# Patient Record
Sex: Male | Born: 1950 | Race: Black or African American | Hispanic: No | Marital: Married | Smoking: Never smoker
Health system: Southern US, Community
[De-identification: ages and names within clinical notes are randomized; demographics above are authoritative.]

## PROBLEM LIST (undated history)

## (undated) DIAGNOSIS — E7800 Pure hypercholesterolemia, unspecified: Secondary | ICD-10-CM

## (undated) DIAGNOSIS — K589 Irritable bowel syndrome without diarrhea: Secondary | ICD-10-CM

## (undated) DIAGNOSIS — G4733 Obstructive sleep apnea (adult) (pediatric): Secondary | ICD-10-CM

## (undated) DIAGNOSIS — E291 Testicular hypofunction: Secondary | ICD-10-CM

## (undated) DIAGNOSIS — J411 Mucopurulent chronic bronchitis: Secondary | ICD-10-CM

## (undated) DIAGNOSIS — K635 Polyp of colon: Secondary | ICD-10-CM

## (undated) DIAGNOSIS — J189 Pneumonia, unspecified organism: Secondary | ICD-10-CM

## (undated) DIAGNOSIS — M7580 Other shoulder lesions, unspecified shoulder: Secondary | ICD-10-CM

## (undated) DIAGNOSIS — N529 Male erectile dysfunction, unspecified: Secondary | ICD-10-CM

## (undated) HISTORY — PX: OTHER SURGICAL HISTORY: SHX169

## (undated) HISTORY — DX: Pure hypercholesterolemia, unspecified: E78.00

## (undated) HISTORY — DX: Obstructive sleep apnea (adult) (pediatric): G47.33

## (undated) HISTORY — DX: Mucopurulent chronic bronchitis: J41.1

## (undated) HISTORY — DX: Testicular hypofunction: E29.1

## (undated) HISTORY — DX: Irritable bowel syndrome, unspecified: K58.9

## (undated) HISTORY — DX: Other shoulder lesions, unspecified shoulder: M75.80

## (undated) HISTORY — DX: Male erectile dysfunction, unspecified: N52.9

## (undated) HISTORY — DX: Polyp of colon: K63.5

## (undated) HISTORY — DX: Pneumonia, unspecified organism: J18.9

---

## 2001-11-25 ENCOUNTER — Ambulatory Visit (HOSPITAL_COMMUNITY): Admission: RE | Admit: 2001-11-25 | Discharge: 2001-11-25 | Payer: Self-pay | Admitting: Family Medicine

## 2001-11-25 ENCOUNTER — Encounter: Payer: Self-pay | Admitting: Family Medicine

## 2003-12-01 ENCOUNTER — Ambulatory Visit (HOSPITAL_COMMUNITY): Admission: RE | Admit: 2003-12-01 | Discharge: 2003-12-01 | Payer: Self-pay | Admitting: Family Medicine

## 2004-01-10 ENCOUNTER — Ambulatory Visit (HOSPITAL_COMMUNITY): Admission: RE | Admit: 2004-01-10 | Discharge: 2004-01-10 | Payer: Self-pay | Admitting: Gastroenterology

## 2004-01-10 ENCOUNTER — Encounter (INDEPENDENT_AMBULATORY_CARE_PROVIDER_SITE_OTHER): Payer: Self-pay | Admitting: Specialist

## 2009-02-16 ENCOUNTER — Encounter: Admission: RE | Admit: 2009-02-16 | Discharge: 2009-02-16 | Payer: Self-pay | Admitting: Family Medicine

## 2010-02-13 IMAGING — CR DG CHEST 2V
2 series · 2 of 2 positions shown · non-contrast
Comparison: No prior studies.

CLINICAL DATA: Cough for 3 months.

CHEST - 2 VIEW

[view not recorded (1 of 2)]
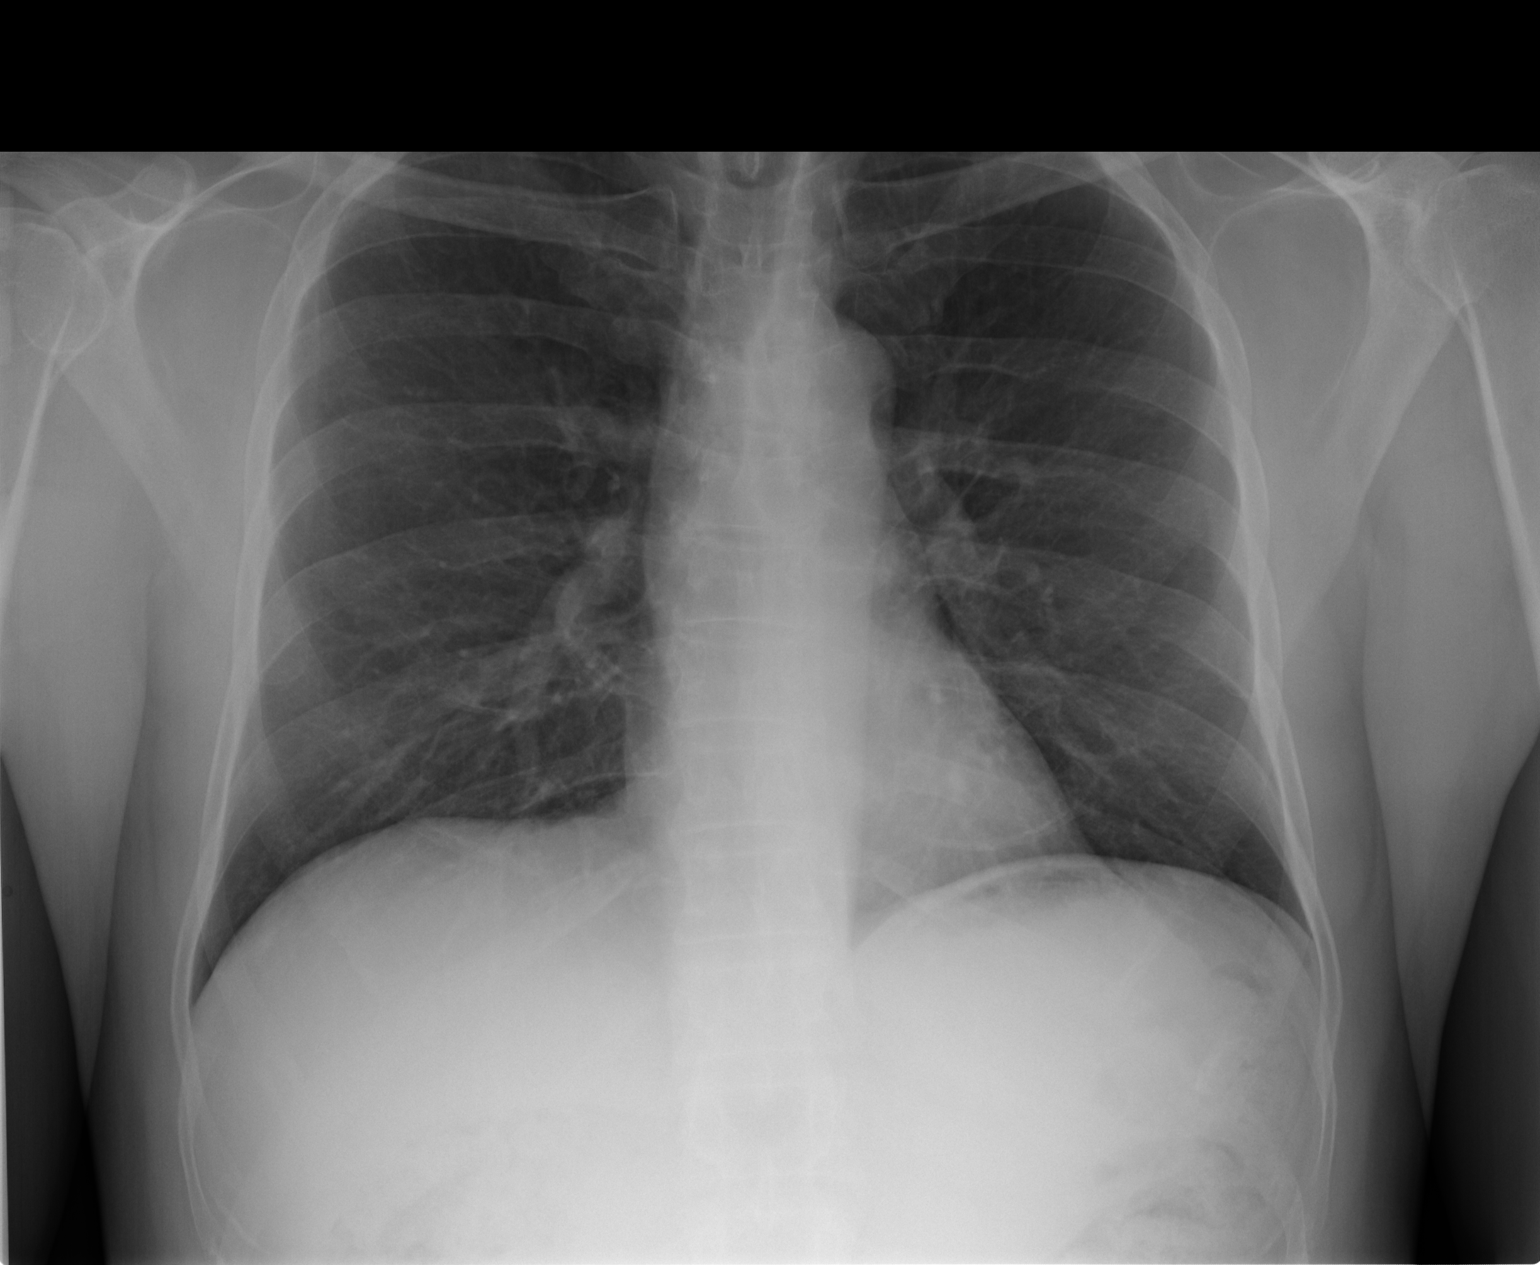

[view not recorded (2 of 2)]
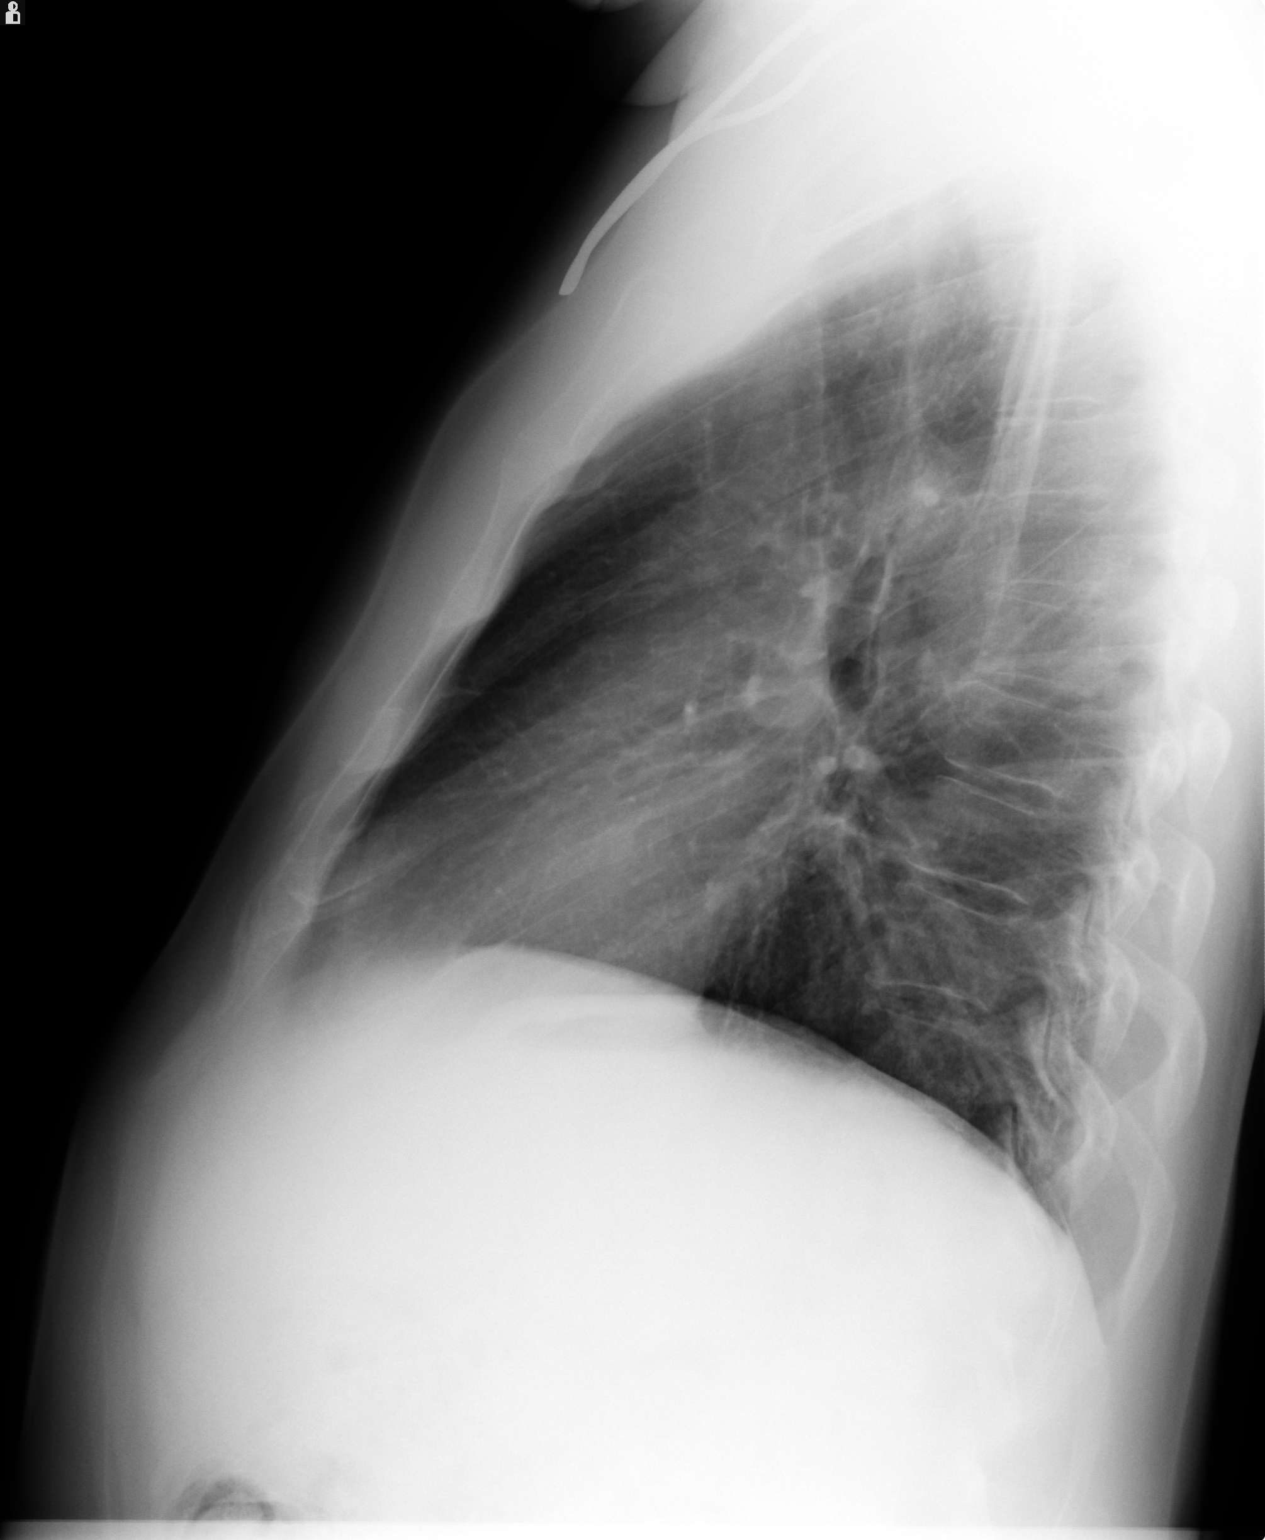

[2 of 2 positions shown; findings below may reference images not displayed]

FINDINGS: The lungs are clear.  There are mildly accentuated
bronchial markings.  The heart and mediastinal structures have a
normal appearance.
IMPRESSION: Mildly accentuated bronchial markings consistent with bronchitic
changes.  No acute infiltrates.

## 2011-05-04 NOTE — Op Note (Signed)
NAME:  STOCKTON, NUNLEY                      ACCOUNT NO.:  [id number]   MEDICAL RECORD NO.:  [id number]                   PATIENT TYPE:  AMB   LOCATION:  ENDO                                 FACILITY:  [facility]   PHYSICIAN:  Petra Kuba, M.D.                 DATE OF BIRTH:  06-15-1951   DATE OF PROCEDURE:  01/10/2004  DATE OF DISCHARGE:                                 OPERATIVE REPORT   PROCEDURE:  Colonoscopy with hot biopsy.   INDICATION:  Patient with a family history of colon cancer, increased  constipation, due for colonic screening.  Consent was signed after risks,  benefits, methods, options thoroughly discussed in the office and before any  premedications given.   MEDICINES USED:  1. Demerol 50.  2. Versed 10.   DESCRIPTION OF PROCEDURE:  Rectal inspection was pertinent for external  hemorrhoids, small.  Digital exam was negative.  Video pediatric adjustable  colonoscope was inserted and with some difficulty due to some tortuosity and  some looping, was able to be advanced to the cecum.  No abnormality was seen  on insertion.  To advance to the cecum required rolling him on his back and  various abdominal pressures.  No abnormality was seen on insertion.  The  cecum was identified by the appendiceal orifice and the ileocecal valve.  The prep was adequate.  There was some liquid stool that required washing  and suctioning.  On slow withdrawal through the colon, however, there were  no abnormalities seen.  When we fell back around a tortuous loop, we did try  to readvance around the curves to decrease chances of missing things.  He  did not tolerate insertion very well, but there were no obvious  complications.  He had some difficulty holding air which made complete  sigmoid evaluation difficult.  In the distal rectum, a tiny polyp was seen  and was hot biopsied x 1.  Anorectal pull-through and retroflexion confirmed  some small hemorrhoids.  Scope was reinserted a short  ways up the left side  of the colon; air was suctioned and scope removed.  The patient tolerated  the procedure fair.  There was no obvious immediate complication.   ENDOSCOPIC DIAGNOSES:  1. Internal-external small hemorrhoids.  2. Tiny rectal distal polyp, hot biopsied.  3. Otherwise within normal limits to the cecum except for some tortuosity     and looping.   PLAN:  1. Await pathology to determine future colonic screening.  2. Consider Miralax or Crystallose p.r.n.  3. Happy to see back p.r.n.  4. Otherwise, yearly rectals and guaiacs per Dr. Cliffton Asters.  5. Consider Diprivan and/or a regular scope next time to see if that will     help with the looping because it would be stiffer.  Petra Kuba, M.D.    MEM/MEDQ  D:  01/10/2004  T:  01/10/2004  Job:  [id number]   cc:   Stacie Acres. White, M.D.  [address]  [place]  [place] [address]  Fax: [phone]

## 2011-08-28 ENCOUNTER — Ambulatory Visit
Admission: RE | Admit: 2011-08-28 | Discharge: 2011-08-28 | Disposition: A | Discharge status: Home / Self Care | Payer: BC Managed Care – PPO | Source: Ambulatory Visit | Attending: Family Medicine | Admitting: Family Medicine

## 2011-08-28 ENCOUNTER — Other Ambulatory Visit: Payer: Self-pay | Admitting: Family Medicine

## 2011-08-28 DIAGNOSIS — M25562 Pain in left knee: Secondary | ICD-10-CM

## 2011-11-14 ENCOUNTER — Encounter (HOSPITAL_BASED_OUTPATIENT_CLINIC_OR_DEPARTMENT_OTHER): Admission: RE | Payer: Self-pay | Source: Ambulatory Visit

## 2011-11-14 ENCOUNTER — Ambulatory Visit (HOSPITAL_BASED_OUTPATIENT_CLINIC_OR_DEPARTMENT_OTHER): Admission: RE | Admit: 2011-11-14 | Payer: 59 | Source: Ambulatory Visit | Admitting: Orthopedic Surgery

## 2011-11-14 SURGERY — ARTHROSCOPY, KNEE
Anesthesia: General | Laterality: Left

## 2017-01-01 ENCOUNTER — Telehealth: Payer: Self-pay

## 2017-01-01 NOTE — Telephone Encounter (Signed)
SENT NOTES TO SCHEDULING 

## 2017-01-23 DIAGNOSIS — I2089 Other forms of angina pectoris: Secondary | ICD-10-CM | POA: Insufficient documentation

## 2017-01-23 DIAGNOSIS — I208 Other forms of angina pectoris: Secondary | ICD-10-CM

## 2017-01-25 ENCOUNTER — Encounter: Payer: Self-pay | Admitting: Cardiology

## 2017-01-25 ENCOUNTER — Encounter (INDEPENDENT_AMBULATORY_CARE_PROVIDER_SITE_OTHER): Payer: Self-pay

## 2017-01-25 ENCOUNTER — Ambulatory Visit (INDEPENDENT_AMBULATORY_CARE_PROVIDER_SITE_OTHER): Payer: BLUE CROSS/BLUE SHIELD | Admitting: Cardiology

## 2017-01-25 VITALS — BP 124/70 | HR 89 | Ht 69.5 in | Wt 173.6 lb

## 2017-01-25 DIAGNOSIS — R0789 Other chest pain: Secondary | ICD-10-CM | POA: Diagnosis not present

## 2017-01-25 NOTE — Progress Notes (Signed)
Cardiology Office Note    Date:  01/25/2017   ID:  Dennis Shaffer, DOB 1951-06-13, MRN [id number]  PCP:  No primary care provider on file.  Cardiologist:   Donato SchultzMark Lacole Komorowski, MD     History of Present Illness:  Dennis Shaffer is a 66 y.o. male here for evaluation of angina at the request of Dr. Susa SimmondsVia. He's been having chest discomfort with physical activity such as playing basketball with his children running around. He feels some shortness of breath that seems to resolve with rest. About 6 years ago had a stress test that was low risk. 08/23/2009-normal study, no ischemia. Heart ejection fraction 64%.  He is also noting pain in the center of his chest when walking up an incline in his neighborhood. This is intermittent. He thought that this was secondary to bronchitis, pleuritis several years ago. Sensation is described as breathing and something really cold. He also describes an occasional palpitation when stressed, flop, likely PVC.  No TOB, no DM. 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  Cialis intermittently  Past Medical History:  Diagnosis Date  . Androgen deficiency   . Bronchitis, mucopurulent recurrent (HCC)   . Colon polyps   . ED (erectile dysfunction)   . Elevated LDL cholesterol level   . IBS (irritable bowel syndrome)   . OSA (obstructive sleep apnea)   . Rotator cuff tendonitis     Past Surgical History:  Procedure Laterality Date  . colonscopy      Current Medications: No outpatient prescriptions prior to visit.   No facility-administered medications prior to visit.      Allergies:   Naprosyn [naproxen] and Voltaren [diclofenac sodium]   Social History   Social History  . Marital status: Single    Spouse name: N/A  . Number of children: N/A  . Years of education: N/A   Social History Main Topics  . Smoking status: Never  Smoker  . Smokeless tobacco: Never Used  . Alcohol use Yes     Comment: rarely  . Drug use: No  . Sexual activity: Not Asked   Other Topics Concern  . None   Social History Narrative  . None     Family History:  The patient's family history includes Cancer in his maternal grandfather; Gallstones in his mother; Healthy in his daughter and daughter.   ROS:   Please see the history of present illness.    ROS All other systems reviewed and are negative.   PHYSICAL EXAM:   VS:  BP 124/70   Pulse 89   Ht 5' 9.5" (1.765 m)   Wt 173 lb 9.6 oz (78.7 kg)   BMI 25.27 kg/m    GEN: Well nourished, well developed, in no acute distress  HEENT: normal  Neck: no JVD, carotid bruits, or masses Cardiac: RRR; no murmurs, rubs, or gallops,no edema  Respiratory:  clear to auscultation bilaterally, normal work of breathing GI: soft, nontender, nondistended, + BS MS: no deformity or atrophy  Skin: warm and dry, no rash Neuro:  Alert and Oriented x 3, Strength and sensation are intact Psych: euthymic mood, full affect  Wt Readings from Last 3 Encounters:  01/25/17 173 lb 9.6 oz (78.7 kg)      Studies/Labs Reviewed:   EKG:  EKG is ordered today.  The ekg ordered today demonstrates 01/25/17  shows sinus rhythm rate 89, inferior Q waves, nonspecific ST-T wave changes.  Recent Labs: No results found for requested labs within last 8760 hours.   Lipid Panel No results found for: CHOL, TRIG, HDL, CHOLHDL, VLDL, LDLCALC, LDLDIRECT  Additional studies/ records that were reviewed today include:  Prior lab work, office notes, stress test reviewed    ASSESSMENT:    1. Other chest pain      PLAN:  In order of problems listed above:  Chest pain  - Been experiencing occasional intermittent exertional chest discomfort when walking uphill for instance.  - We will go ahead and check a nuclear stress test, treadmill  - Could still be musculoskeletal perhaps respiratory  Otherwise, no  evidence of hypertension. No medications. Continuing with lifestyle modification, diet, exercise.     Medication Adjustments/Labs and Tests Ordered: Current medicines are reviewed at length with the patient today.  Concerns regarding medicines are outlined above.  Medication changes, Labs and Tests ordered today are listed in the Patient Instructions below. Patient Instructions  Medication Instructions:  The current medical regimen is effective;  continue present plan and medications.  Testing/Procedures: Your physician has requested that you have myoview. For further information please visit [email or web address] Please follow instruction sheet, as given.  Follow-Up: Follow up as needed after the above testing.  Thank you for choosing [facility]!!        Signed, Donato Schultz, MD  01/25/2017 2:44 PM    [facility] [address] [place], [place], [place]  [address] Phone: [phone]; Fax: [phone]

## 2017-01-25 NOTE — Patient Instructions (Signed)
Medication Instructions:  The current medical regimen is effective;  continue present plan and medications.  Testing/Procedures: Your physician has requested that you have myoview. For further information please visit [email or web address] Please follow instruction sheet, as given.  Follow-Up: Follow up as needed after the above testing.  Thank you for choosing [facility]!!

## 2017-02-07 ENCOUNTER — Telehealth (HOSPITAL_COMMUNITY): Payer: Self-pay | Admitting: *Deleted

## 2017-02-07 NOTE — Telephone Encounter (Signed)
Left message on voicemail per DPR in reference to upcoming appointment scheduled on 02/12/17 with detailed instructions given per Myocardial Perfusion Study Information Sheet for the test. LM to arrive 15 minutes early, and that it is imperative to arrive on time for appointment to keep from having the test rescheduled. If you need to cancel or reschedule your appointment, please call the office within 24 hours of your appointment. Failure to do so may result in a cancellation of your appointment, and a $50 no show fee. Phone number given for call back for any questions. [name] Jacqueline    

## 2017-02-08 ENCOUNTER — Encounter (HOSPITAL_COMMUNITY): Payer: BLUE CROSS/BLUE SHIELD

## 2017-02-12 ENCOUNTER — Encounter (HOSPITAL_COMMUNITY): Payer: BLUE CROSS/BLUE SHIELD

## 2017-03-13 ENCOUNTER — Telehealth (HOSPITAL_COMMUNITY): Payer: Self-pay | Admitting: Cardiology

## 2017-03-13 NOTE — Telephone Encounter (Signed)
Pt cancelled appt on 2/23, 2/27 and lmsg for pt to call back on 3/20.   Elita Booneegina A [place] Dube Date/time: 03/05/2017 12:04 PM  Comment: Called pt and lmsg for him to CB to r/s myoview appt.   Context: [place] Schedule Orders/Appt Requests Outcome: Left Message  Phone number: [phone] Phone Type: Mobile  Comm. type: Telephone Call type: Outgoing  Contact: Shawn RouteHinton, Sloane M

## 2017-09-17 DIAGNOSIS — Z0000 Encounter for general adult medical examination without abnormal findings: Secondary | ICD-10-CM | POA: Diagnosis not present

## 2017-09-17 DIAGNOSIS — N529 Male erectile dysfunction, unspecified: Secondary | ICD-10-CM | POA: Diagnosis not present

## 2017-09-17 DIAGNOSIS — Z125 Encounter for screening for malignant neoplasm of prostate: Secondary | ICD-10-CM | POA: Diagnosis not present

## 2017-09-17 DIAGNOSIS — E7800 Pure hypercholesterolemia, unspecified: Secondary | ICD-10-CM | POA: Diagnosis not present

## 2017-09-17 DIAGNOSIS — Z23 Encounter for immunization: Secondary | ICD-10-CM | POA: Diagnosis not present

## 2017-09-17 DIAGNOSIS — N400 Enlarged prostate without lower urinary tract symptoms: Secondary | ICD-10-CM | POA: Diagnosis not present

## 2020-08-29 ENCOUNTER — Other Ambulatory Visit: Payer: Self-pay

## 2020-08-29 ENCOUNTER — Ambulatory Visit: Payer: Medicare Other | Admitting: Pulmonary Disease

## 2020-08-29 ENCOUNTER — Ambulatory Visit (INDEPENDENT_AMBULATORY_CARE_PROVIDER_SITE_OTHER): Payer: Medicare Other

## 2020-08-29 ENCOUNTER — Encounter: Payer: Self-pay | Admitting: Pulmonary Disease

## 2020-08-29 VITALS — BP 128/80 | HR 80 | Temp 97.3°F | Ht 70.0 in | Wt 169.4 lb

## 2020-08-29 DIAGNOSIS — R053 Chronic cough: Secondary | ICD-10-CM

## 2020-08-29 DIAGNOSIS — R05 Cough: Secondary | ICD-10-CM

## 2020-08-29 NOTE — Patient Instructions (Signed)
We will get a chest x-ray today Check CBC with differential, IgE Start Flonase nasal spray Use chlorpheniramine 8 mg 3 times daily.  This medication is available over-the-counter Watch out for side effects of excessive sleepiness.  Schedule pulmonary function test Follow-up in 1 to 2 months.

## 2020-08-29 NOTE — Progress Notes (Signed)
Dennis Shaffer    [id number]    1951/10/04  Primary Care Physician:Brake, Resa Miner, FNP  Referring Physician: No referring provider defined for this encounter.  Chief complaint: Consult for chronic cough  HPI:  69 year old with history of asthma, hyperlipidemia, allergies Has chronic cough since spring 2021.  Describes particular the back of the throat with irritation, sensation of throat closing up Cough produces clear mucus.  No dyspnea  Pets: No pets Occupation: Works in Firefighter as a Technical brewer Exposures: No known exposures.  No mold, hot tub, Jacuzzi Smoking history: Never smoker Travel history: Previously lived in [place] and [place]. Relevant family history: No significant family history of lung disease  Outpatient Encounter Medications as of 08/29/2020  Medication Sig  . albuterol (VENTOLIN HFA) 108 (90 Base) MCG/ACT inhaler Inhale 2 puffs into the lungs every 6 (six) hours as needed for wheezing or shortness of breath.  . fluticasone (FLONASE) 50 MCG/ACT nasal spray Place 2 sprays into both nostrils daily.   No facility-administered encounter medications on file as of 08/29/2020.    Allergies as of 08/29/2020 - Review Complete 08/29/2020  Allergen Reaction Noted  . Naprosyn [naproxen]  01/23/2017  . Voltaren [diclofenac sodium]  01/23/2017    Past Medical History:  Diagnosis Date  . Androgen deficiency   . Bronchitis, mucopurulent recurrent (HCC)   . Colon polyps   . ED (erectile dysfunction)   . Elevated LDL cholesterol level   . IBS (irritable bowel syndrome)   . OSA (obstructive sleep apnea)   . Rotator cuff tendonitis     Past Surgical History:  Procedure Laterality Date  . colonscopy      Family History  Problem Relation Age of Onset  . Gallstones Mother   . Cancer Maternal Grandfather        colon  . Healthy Daughter   . Healthy Daughter     Social History   Socioeconomic History  . Marital status:  Single    Spouse name: Not on file  . Number of children: Not on file  . Years of education: Not on file  . Highest education level: Not on file  Occupational History  . Not on file  Tobacco Use  . Smoking status: Never Smoker  . Smokeless tobacco: Never Used  Substance and Sexual Activity  . Alcohol use: Yes    Comment: rarely  . Drug use: No  . Sexual activity: Not on file  Other Topics Concern  . Not on file  Social History Narrative  . Not on file   Social Determinants of Health   Financial Resource Strain:   . Difficulty of Paying Living Expenses: Not on file  Food Insecurity:   . Worried About Programme researcher, broadcasting/film/video in the Last Year: Not on file  . Ran Out of Food in the Last Year: Not on file  Transportation Needs:   . Lack of Transportation (Medical): Not on file  . Lack of Transportation (Non-Medical): Not on file  Physical Activity:   . Days of Exercise per Week: Not on file  . Minutes of Exercise per Session: Not on file  Stress:   . Feeling of Stress : Not on file  Social Connections:   . Frequency of Communication with Friends and Family: Not on file  . Frequency of Social Gatherings with Friends and Family: Not on file  . Attends Religious Services: Not on file  . Active Member of Clubs  or Organizations: Not on file  . Attends Banker Meetings: Not on file  . Marital Status: Not on file  Intimate Partner Violence:   . Fear of Current or Ex-Partner: Not on file  . Emotionally Abused: Not on file  . Physically Abused: Not on file  . Sexually Abused: Not on file    Review of systems: Review of Systems  Constitutional: Negative for fever and chills.  HENT: Negative.   Eyes: Negative for blurred vision.  Respiratory: as per HPI  Cardiovascular: Negative for chest pain and palpitations.  Gastrointestinal: Negative for vomiting, diarrhea, blood per rectum. Genitourinary: Negative for dysuria, urgency, frequency and hematuria.    Musculoskeletal: Negative for myalgias, back pain and joint pain.  Skin: Negative for itching and rash.  Neurological: Negative for dizziness, tremors, focal weakness, seizures and loss of consciousness.  Endo/Heme/Allergies: Negative for environmental allergies.  Psychiatric/Behavioral: Negative for depression, suicidal ideas and hallucinations.  All other systems reviewed and are negative.  Physical Exam: Blood pressure 128/80, pulse 80, temperature (!) 97.3 F (36.3 C), temperature source Oral, height 5\' 10"  (1.778 m), weight 169 lb 6.4 oz (76.8 kg), SpO2 99 %. Gen:      No acute distress HEENT:  EOMI, sclera anicteric Neck:     No masses; no thyromegaly Lungs:    Clear to auscultation bilaterally; normal respiratory effort CV:         Regular rate and rhythm; no murmurs Abd:      + bowel sounds; soft, non-tender; no palpable masses, no distension Ext:    No edema; adequate peripheral perfusion Skin:      Warm and dry; no rash Neuro: alert and oriented x 3 Psych: normal mood and affect  Data Reviewed: Imaging: Chest x-ray 02/16/2009- mild increase in bronchial markings.  I have reviewed the images personally.  PFTs:  Labs:  Assessment:  Chronic cough Likely upper airway cough secondary to postnasal drip May have reactive airway disease in addition given prior imaging with bronchial wall thickening  Repeat chest x-ray Check CBC differential  Start Flonase, chlorpheniramine antihistamine Schedule pulmonary function test  Plan/Recommendations: Chest x-ray CBC, IgE, PFTs Flonase, chlorpheniramine  04/18/2009 MD [facility] 08/29/2020, 11:21 AM  CC: No ref. provider found

## 2020-09-09 ENCOUNTER — Encounter: Payer: Self-pay | Admitting: Pulmonary Disease

## 2020-09-13 NOTE — Progress Notes (Signed)
Called and left message for patient to return call.  

## 2020-09-15 ENCOUNTER — Other Ambulatory Visit (INDEPENDENT_AMBULATORY_CARE_PROVIDER_SITE_OTHER): Payer: Medicare Other

## 2020-09-15 DIAGNOSIS — R05 Cough: Secondary | ICD-10-CM

## 2020-09-15 DIAGNOSIS — R053 Chronic cough: Secondary | ICD-10-CM

## 2020-09-16 LAB — CBC WITH DIFFERENTIAL/PLATELET
Basophils Absolute: 0.1 10*3/uL (ref 0.0–0.1)
Basophils Relative: 1.2 % (ref 0.0–3.0)
Eosinophils Absolute: 0.2 10*3/uL (ref 0.0–0.7)
Eosinophils Relative: 3.2 % (ref 0.0–5.0)
HCT: 38.4 % — ABNORMAL LOW (ref 39.0–52.0)
Hemoglobin: 12.5 g/dL — ABNORMAL LOW (ref 13.0–17.0)
Lymphocytes Relative: 36.8 % (ref 12.0–46.0)
Lymphs Abs: 2.3 10*3/uL (ref 0.7–4.0)
MCHC: 32.6 g/dL (ref 30.0–36.0)
MCV: 88.2 fl (ref 78.0–100.0)
Monocytes Absolute: 0.7 10*3/uL (ref 0.1–1.0)
Monocytes Relative: 10.5 % (ref 3.0–12.0)
Neutro Abs: 3.1 10*3/uL (ref 1.4–7.7)
Neutrophils Relative %: 48.3 % (ref 43.0–77.0)
Platelets: 267 10*3/uL (ref 150.0–400.0)
RBC: 4.35 Mil/uL (ref 4.22–5.81)
RDW: 13.3 % (ref 11.5–15.5)
WBC: 6.4 10*3/uL (ref 4.0–10.5)

## 2020-09-16 LAB — IGE: IgE (Immunoglobulin E), Serum: 33 kU/L (ref <=114)

## 2020-09-21 ENCOUNTER — Encounter: Payer: Self-pay | Admitting: *Deleted

## 2020-09-21 ENCOUNTER — Telehealth: Payer: Self-pay | Admitting: Pulmonary Disease

## 2020-09-21 NOTE — Telephone Encounter (Signed)
Called and spoke with patient, provided results per Dr. Isaiah Serge.  He states he gets a tickle and then a cough possibly from post nasal drip.  States he uses Flonase from time to time and a cough drop helps as well.  Advised that he has an upcoming PFT and visit with Dr. Isaiah Serge on 10/22.  Her verbalized understanding.  Nothing further needed.

## 2020-10-07 ENCOUNTER — Ambulatory Visit (INDEPENDENT_AMBULATORY_CARE_PROVIDER_SITE_OTHER): Payer: Medicare Other

## 2020-10-07 ENCOUNTER — Other Ambulatory Visit: Payer: Self-pay

## 2020-10-07 ENCOUNTER — Ambulatory Visit (INDEPENDENT_AMBULATORY_CARE_PROVIDER_SITE_OTHER): Payer: Medicare Other | Admitting: Pulmonary Disease

## 2020-10-07 ENCOUNTER — Encounter: Payer: Self-pay | Admitting: Pulmonary Disease

## 2020-10-07 ENCOUNTER — Ambulatory Visit: Payer: Medicare Other | Admitting: Pulmonary Disease

## 2020-10-07 VITALS — BP 118/66 | HR 82 | Temp 97.2°F | Ht 69.0 in | Wt 168.0 lb

## 2020-10-07 DIAGNOSIS — R053 Chronic cough: Secondary | ICD-10-CM

## 2020-10-07 LAB — PULMONARY FUNCTION TEST
DL/VA % pred: 111 %
DL/VA: 4.54 ml/min/mmHg/L
DLCO cor % pred: 92 %
DLCO cor: 23.32 ml/min/mmHg
DLCO unc % pred: 86 %
DLCO unc: 21.81 ml/min/mmHg
FEF 25-75 Post: 2.76 L/sec
FEF 25-75 Pre: 2.35 L/sec
FEF2575-%Change-Post: 17 %
FEF2575-%Pred-Post: 113 %
FEF2575-%Pred-Pre: 96 %
FEV1-%Change-Post: 5 %
FEV1-%Pred-Post: 97 %
FEV1-%Pred-Pre: 92 %
FEV1-Post: 2.73 L
FEV1-Pre: 2.59 L
FEV1FVC-%Change-Post: 5 %
FEV1FVC-%Pred-Pre: 100 %
FEV6-%Change-Post: 0 %
FEV6-%Pred-Post: 94 %
FEV6-%Pred-Pre: 95 %
FEV6-Post: 3.35 L
FEV6-Pre: 3.36 L
FEV6FVC-%Pred-Post: 104 %
FEV6FVC-%Pred-Pre: 104 %
FVC-%Change-Post: 0 %
FVC-%Pred-Post: 90 %
FVC-%Pred-Pre: 90 %
FVC-Post: 3.36 L
FVC-Pre: 3.36 L
Post FEV1/FVC ratio: 81 %
Post FEV6/FVC ratio: 100 %
Pre FEV1/FVC ratio: 77 %
Pre FEV6/FVC Ratio: 100 %
RV % pred: 88 %
RV: 2.1 L
TLC % pred: 82 %
TLC: 5.65 L

## 2020-10-07 NOTE — Addendum Note (Signed)
Addended by: Jacquiline Doe on: 10/07/2020 11:46 AM   Modules accepted: Orders

## 2020-10-07 NOTE — Progress Notes (Signed)
Full PFT performed today. °

## 2020-10-07 NOTE — Patient Instructions (Signed)
I am glad you are doing well with regard to your breathing and that the cough is better Your lung function test shows normal lung function We will repeat chest x-ray today to follow-up on the mild bronchial inflammation  If normal then you can follow-up with [place] as needed

## 2020-10-07 NOTE — Progress Notes (Signed)
         Dennis Shaffer    [id number]    February 18, 1951  Primary Care Physician:Brake, Resa Miner, FNP  Referring Physician: Soundra Pilon, FNP [address] [place],  [place] [address]  Chief complaint: Follow-up for chronic cough  HPI:  69 year old with history of asthma, hyperlipidemia, allergies Has chronic cough since spring 2021.  Describes particular the back of the throat with irritation, sensation of throat closing up Cough produces clear mucus.  No dyspnea  Pets: No pets Occupation: Works in Firefighter as a Technical brewer Exposures: No known exposures.  No mold, hot tub, Jacuzzi Smoking history: Never smoker Travel history: Previously lived in [place] and [place]. Relevant family history: No significant family history of lung disease  Interval history: States that he is doing well Cough is improved with Flonase nasal spray.  He started taking Tums for acid reflux He did not start chlorpheniramine because he did not want to deal with the side effects of somnolence.  Outpatient Encounter Medications as of 10/07/2020  Medication Sig  . albuterol (VENTOLIN HFA) 108 (90 Base) MCG/ACT inhaler Inhale 2 puffs into the lungs every 6 (six) hours as needed for wheezing or shortness of breath.  . fluticasone (FLONASE) 50 MCG/ACT nasal spray Place 2 sprays into both nostrils daily.   No facility-administered encounter medications on file as of 10/07/2020.    Physical Exam: Blood pressure 118/66, pulse 82, temperature (!) 97.2 F (36.2 C), temperature source Skin, height 5\' 9"  (1.753 m), weight 168 lb (76.2 kg), SpO2 97 %. Gen:      No acute distress HEENT:  EOMI, sclera anicteric Neck:     No masses; no thyromegaly Lungs:    Clear to auscultation bilaterally; normal respiratory effort CV:         Regular rate and rhythm; no murmurs Abd:      + bowel sounds; soft, non-tender; no palpable masses, no distension Ext:    No edema; adequate peripheral  perfusion Skin:      Warm and dry; no rash Neuro: alert and oriented x 3 Psych: normal mood and affect  Data Reviewed: Imaging: Chest x-ray 02/16/2009- mild increase in bronchial markings.  Chest x-ray 08/29/2020-perihilar interstitial opacity suggesting bronchial inflammation I have reviewed the images personally  PFTs: 10/07/2020 FVC 3.36 [90%], FEV1 2.73 [97%], F/F 81, TLC 5.65 [82%], DLCO 21.81 [86%] Normal test  Labs: CBC 09/15/2020-WBC 6.4, eos 3.2%, absolute eosinophil count 205 IgE 09/15/2020 33  Assessment:  Chronic cough Likely upper airway cough secondary to postnasal drip May have reactive airway disease in addition given prior imaging with bronchial wall thickening however his labs do not show peripheral eosinophilia or elevated IgE PFTs are normal  He is stable on albuterol which he uses very sparingly Continue Flonase, Tums for postnasal drip and GERD respectively Repeat chest x-ray today to follow-up on perihilar opacity.  If normal he can follow-up as needed  Plan/Recommendations: Chest x-ray  09/17/2020 MD [facility] 10/07/2020, 11:33 AM  CC: 10/09/2020, FNP

## 2021-02-02 DIAGNOSIS — H40013 Open angle with borderline findings, low risk, bilateral: Secondary | ICD-10-CM | POA: Diagnosis not present

## 2021-07-24 DIAGNOSIS — B029 Zoster without complications: Secondary | ICD-10-CM | POA: Diagnosis not present

## 2021-08-26 IMAGING — DX DG CHEST 2V
2 series · 2 of 2 positions shown · non-contrast
Comparison: 09/09/2014

CLINICAL DATA: Cough

EXAM:
CHEST - 2 VIEW

[chest pa]
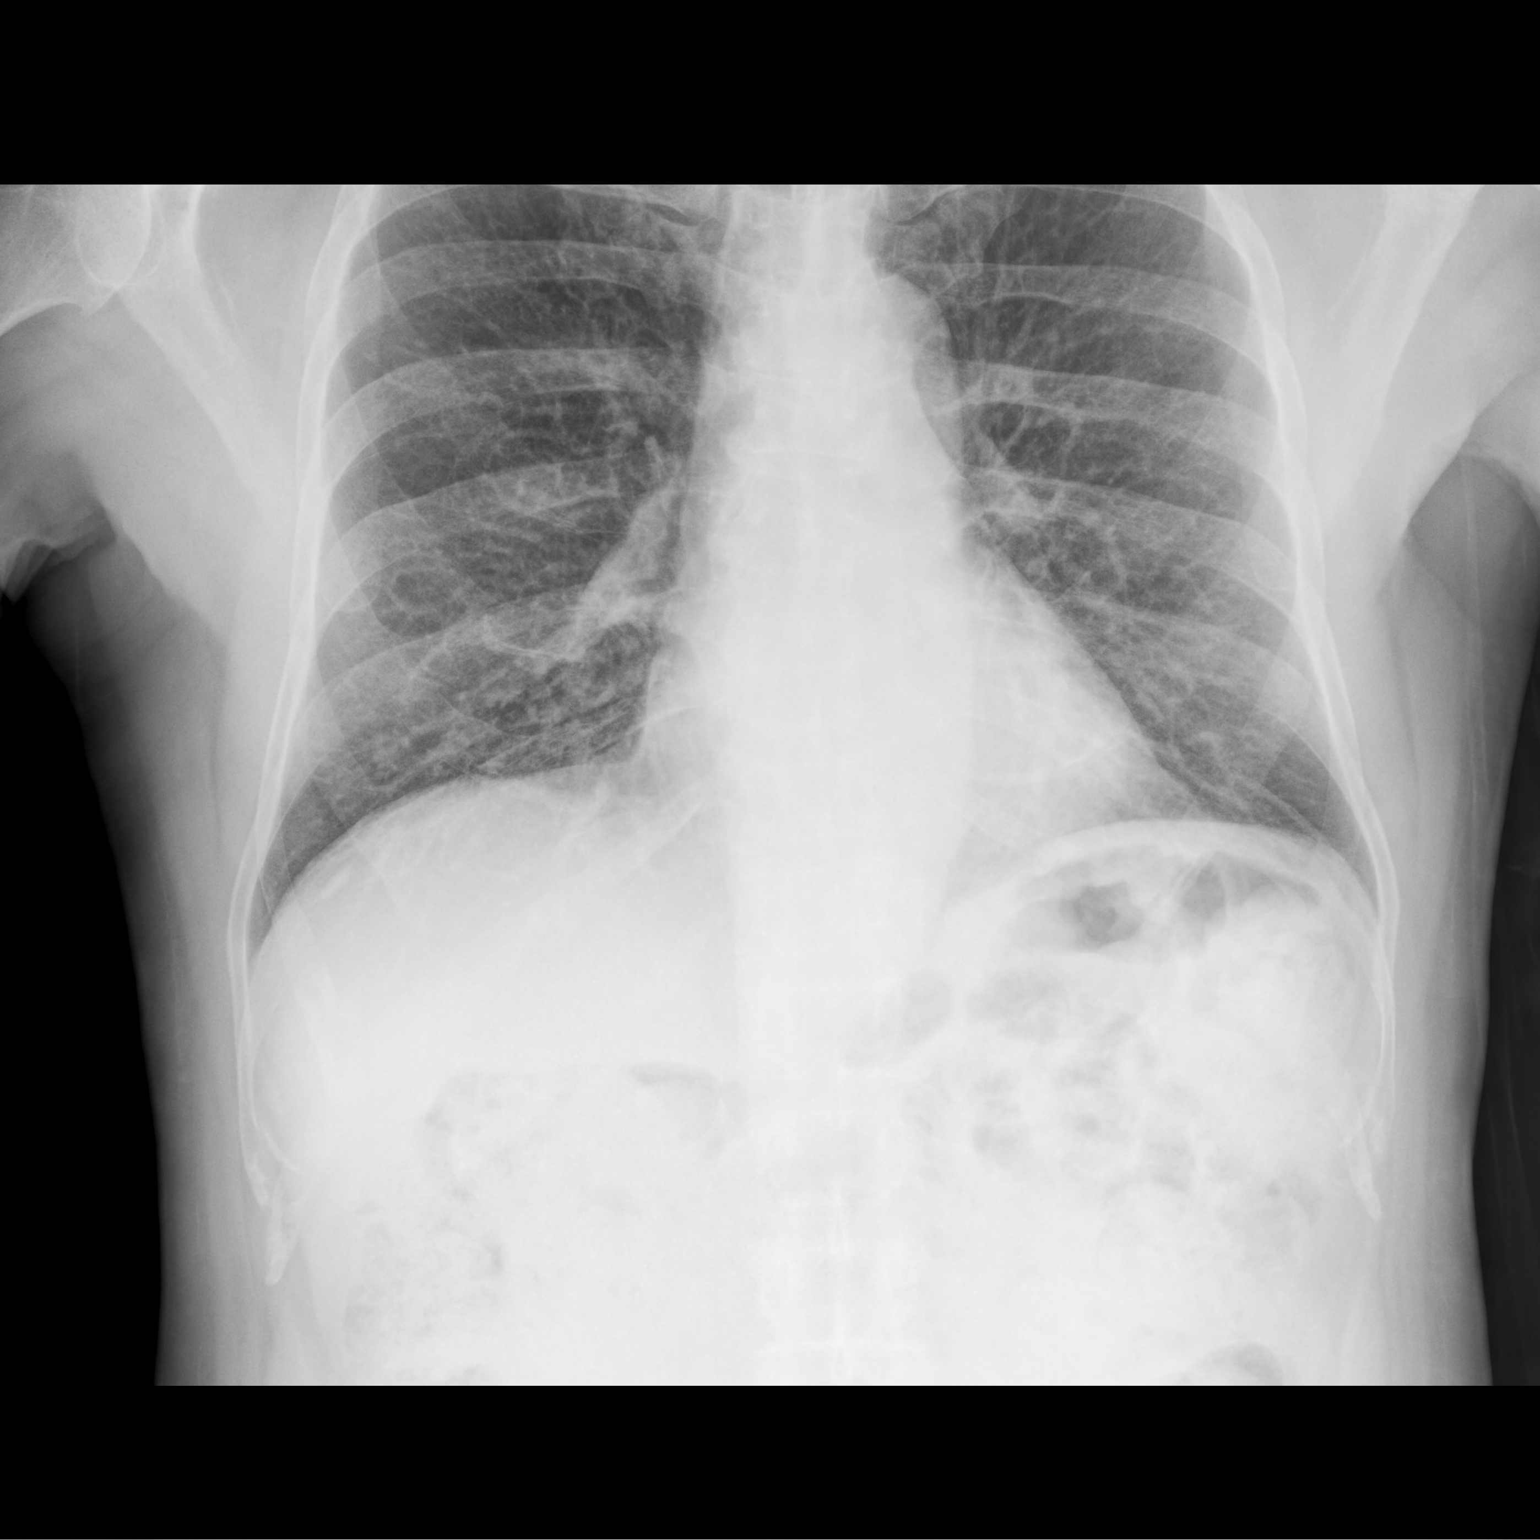

[chest lat]
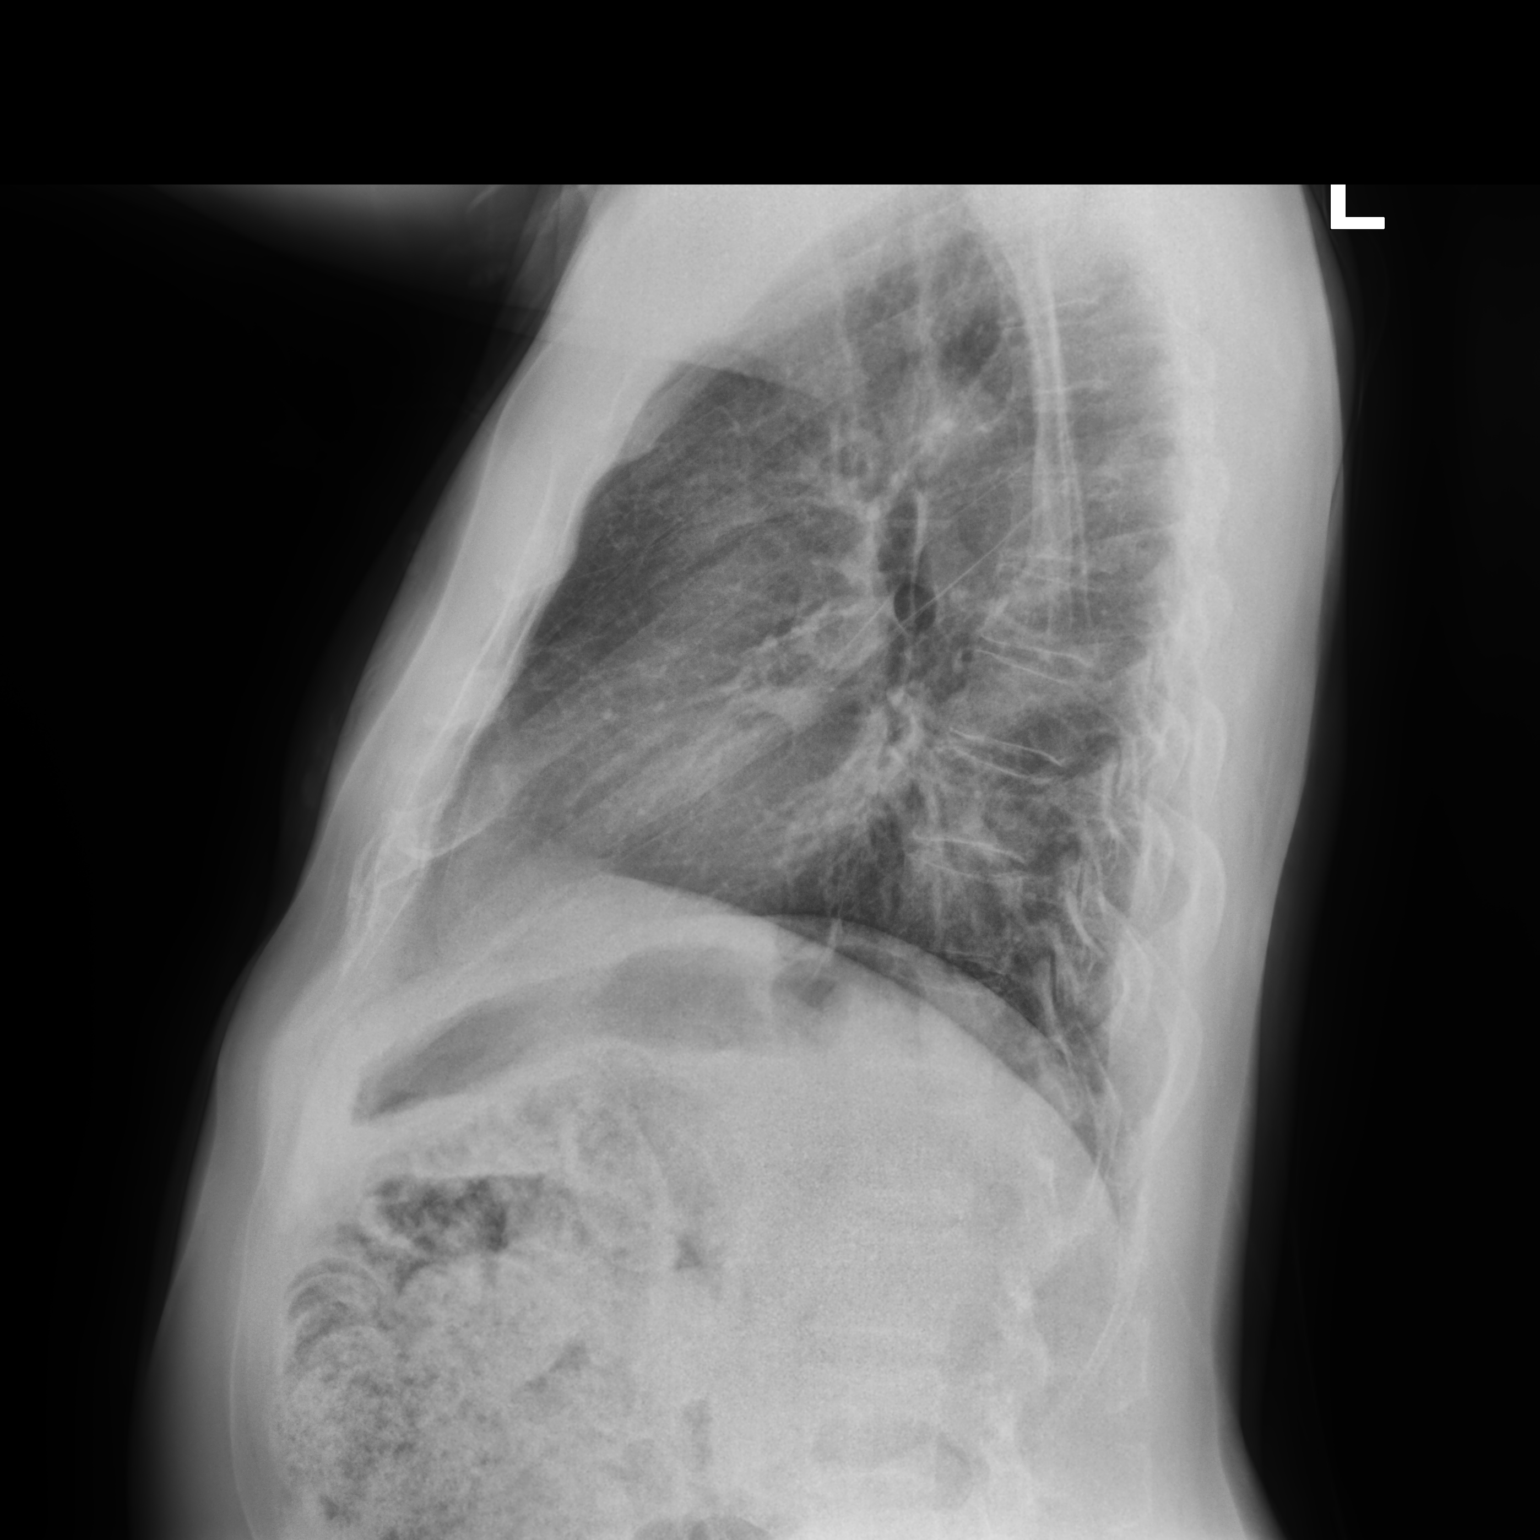

[2 of 2 positions shown; findings below may reference images not displayed]

FINDINGS: Increased perihilar interstitial opacity. No focal consolidation. No
pleural effusion. Normal heart size. No pneumothorax.
IMPRESSION: Increased perihilar interstitial opacity suggesting bronchial
inflammation. No focal pulmonary infiltrate.

## 2021-10-04 IMAGING — DX DG CHEST 2V
2 series · 2 of 2 positions shown · non-contrast
Comparison: 08/29/2020

CLINICAL DATA: Chronic cough. Bronchial inflammation.

EXAM:
CHEST - 2 VIEW

[chest pa]
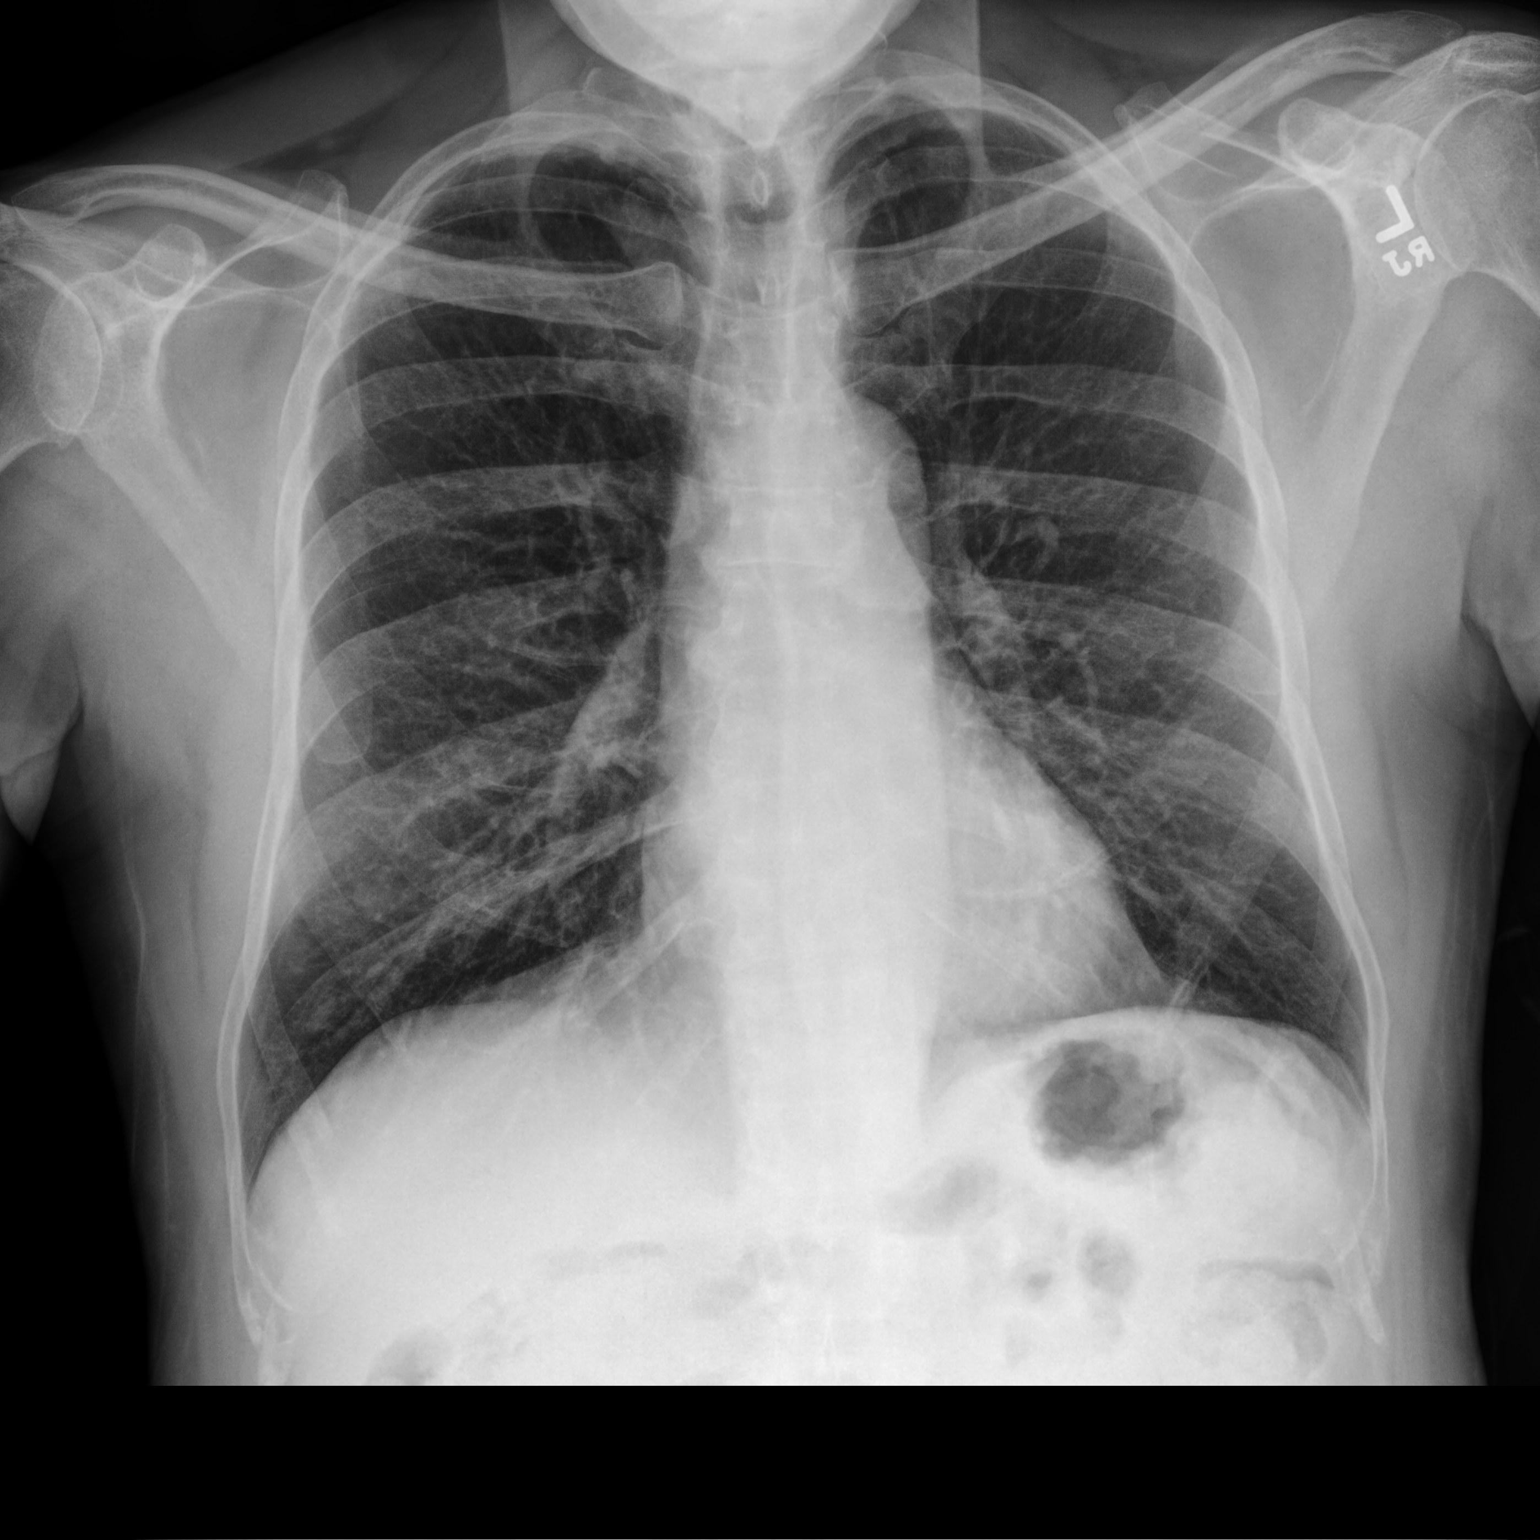

[chest lat]
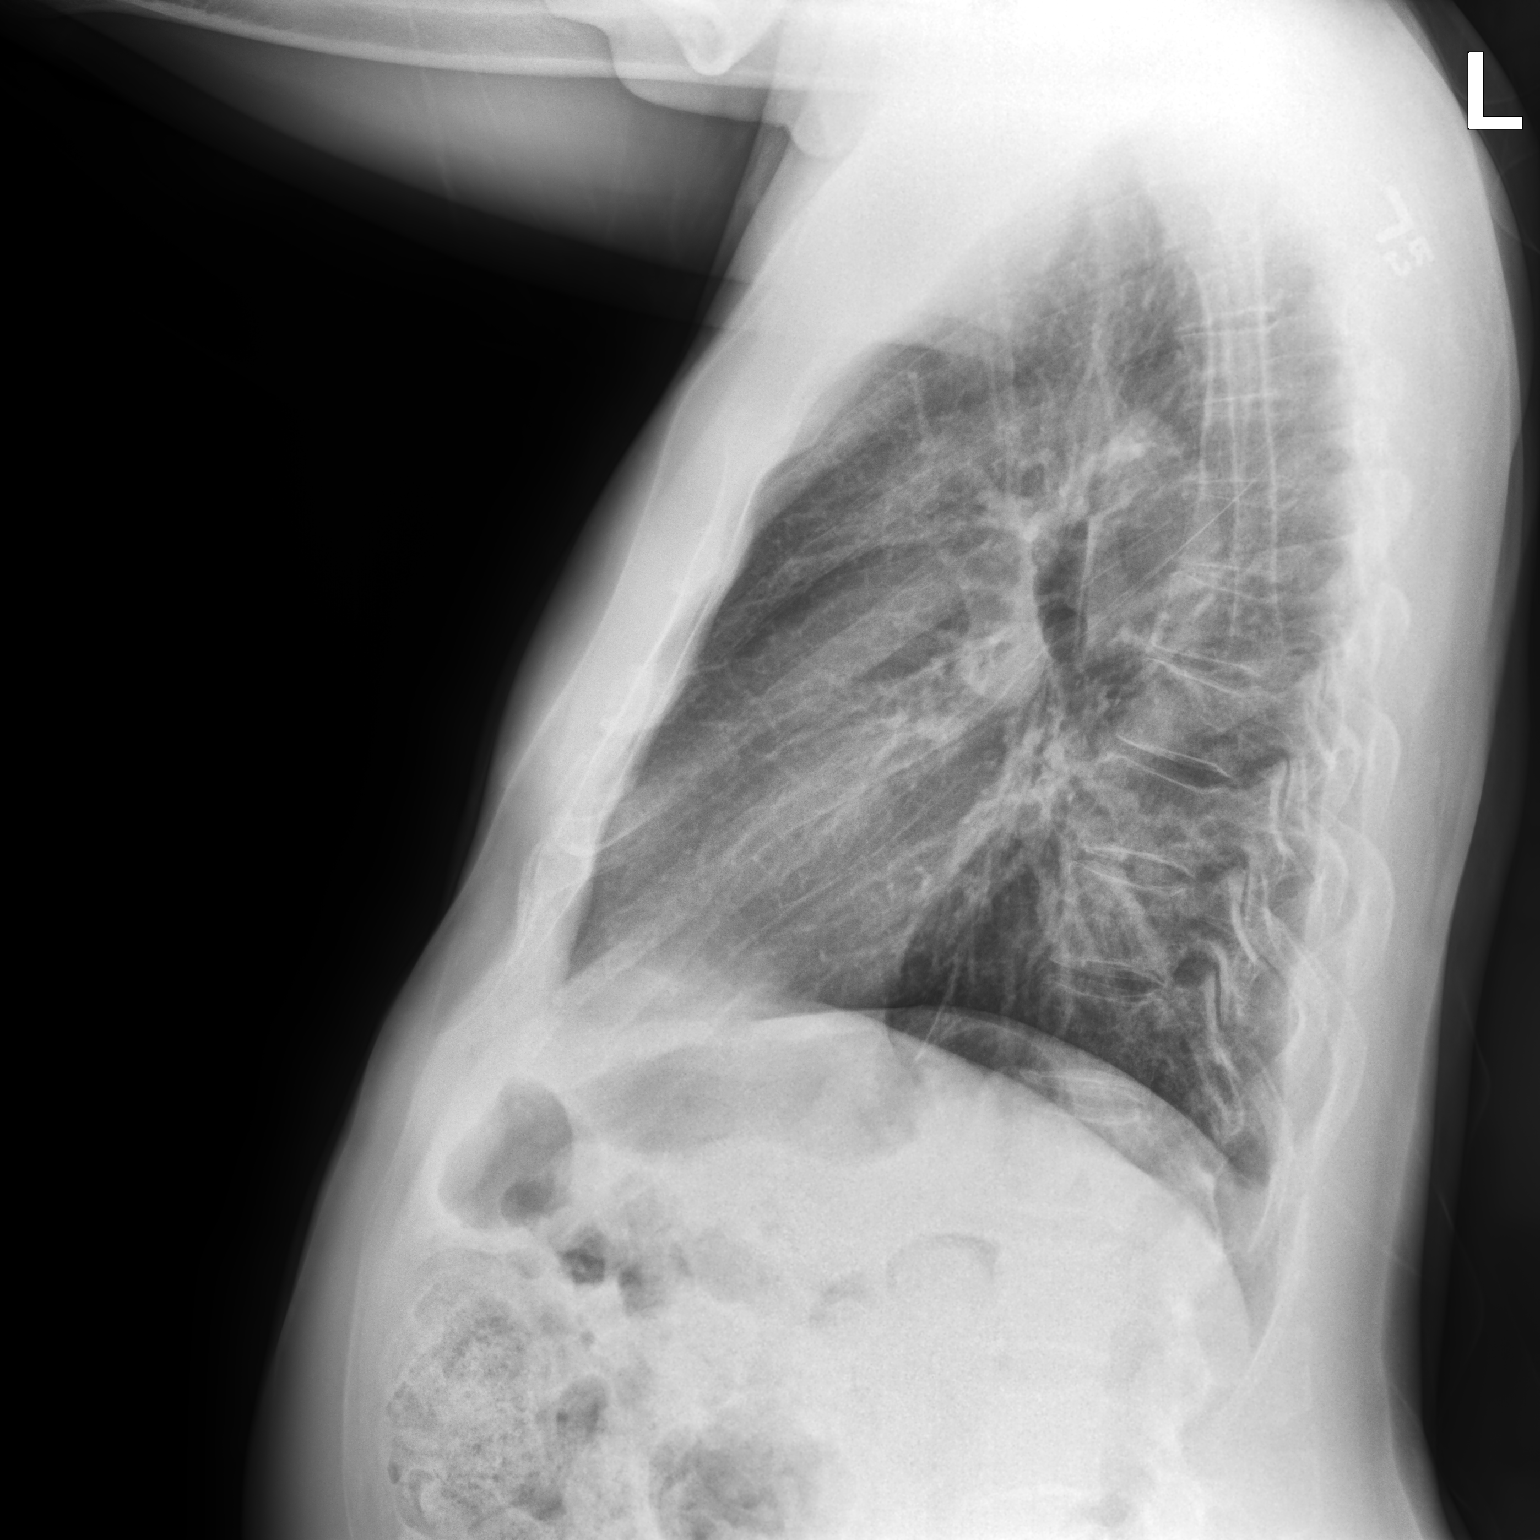

[2 of 2 positions shown; findings below may reference images not displayed]

FINDINGS: Normal sized heart. Clear lungs with normal vascularity. Stable
biapical pleural and parenchymal scarring. Mild thoracic spine
degenerative changes.
IMPRESSION: No acute abnormality.

## 2021-10-05 DIAGNOSIS — J45909 Unspecified asthma, uncomplicated: Secondary | ICD-10-CM | POA: Diagnosis not present

## 2021-10-05 DIAGNOSIS — E7800 Pure hypercholesterolemia, unspecified: Secondary | ICD-10-CM | POA: Diagnosis not present

## 2021-10-05 DIAGNOSIS — Z131 Encounter for screening for diabetes mellitus: Secondary | ICD-10-CM | POA: Diagnosis not present

## 2021-10-05 DIAGNOSIS — Z1211 Encounter for screening for malignant neoplasm of colon: Secondary | ICD-10-CM | POA: Diagnosis not present

## 2021-10-05 DIAGNOSIS — Z0000 Encounter for general adult medical examination without abnormal findings: Secondary | ICD-10-CM | POA: Diagnosis not present

## 2021-12-29 DIAGNOSIS — R0981 Nasal congestion: Secondary | ICD-10-CM | POA: Diagnosis not present

## 2021-12-29 DIAGNOSIS — R059 Cough, unspecified: Secondary | ICD-10-CM | POA: Diagnosis not present

## 2021-12-29 DIAGNOSIS — R6883 Chills (without fever): Secondary | ICD-10-CM | POA: Diagnosis not present

## 2021-12-29 DIAGNOSIS — R52 Pain, unspecified: Secondary | ICD-10-CM | POA: Diagnosis not present

## 2021-12-29 DIAGNOSIS — Z03818 Encounter for observation for suspected exposure to other biological agents ruled out: Secondary | ICD-10-CM | POA: Diagnosis not present

## 2022-03-12 DIAGNOSIS — K635 Polyp of colon: Secondary | ICD-10-CM | POA: Diagnosis not present

## 2022-03-12 DIAGNOSIS — K649 Unspecified hemorrhoids: Secondary | ICD-10-CM | POA: Diagnosis not present

## 2022-03-12 DIAGNOSIS — Z86010 Personal history of colonic polyps: Secondary | ICD-10-CM | POA: Diagnosis not present

## 2022-03-14 DIAGNOSIS — K635 Polyp of colon: Secondary | ICD-10-CM | POA: Diagnosis not present

## 2022-06-29 DIAGNOSIS — H40013 Open angle with borderline findings, low risk, bilateral: Secondary | ICD-10-CM | POA: Diagnosis not present

## 2022-07-07 DIAGNOSIS — U071 COVID-19: Secondary | ICD-10-CM | POA: Diagnosis not present

## 2022-10-11 DIAGNOSIS — E7800 Pure hypercholesterolemia, unspecified: Secondary | ICD-10-CM | POA: Diagnosis not present

## 2022-10-11 DIAGNOSIS — Z0000 Encounter for general adult medical examination without abnormal findings: Secondary | ICD-10-CM | POA: Diagnosis not present

## 2022-10-11 DIAGNOSIS — Z6823 Body mass index (BMI) 23.0-23.9, adult: Secondary | ICD-10-CM | POA: Diagnosis not present

## 2023-03-08 DIAGNOSIS — M25562 Pain in left knee: Secondary | ICD-10-CM | POA: Diagnosis not present

## 2023-06-19 DIAGNOSIS — R262 Difficulty in walking, not elsewhere classified: Secondary | ICD-10-CM | POA: Diagnosis not present

## 2023-06-19 DIAGNOSIS — M25662 Stiffness of left knee, not elsewhere classified: Secondary | ICD-10-CM | POA: Diagnosis not present

## 2023-06-19 DIAGNOSIS — M1712 Unilateral primary osteoarthritis, left knee: Secondary | ICD-10-CM | POA: Diagnosis not present

## 2023-06-19 DIAGNOSIS — M25562 Pain in left knee: Secondary | ICD-10-CM | POA: Diagnosis not present

## 2023-06-26 DIAGNOSIS — M25662 Stiffness of left knee, not elsewhere classified: Secondary | ICD-10-CM | POA: Diagnosis not present

## 2023-06-26 DIAGNOSIS — M25562 Pain in left knee: Secondary | ICD-10-CM | POA: Diagnosis not present

## 2023-06-26 DIAGNOSIS — M1712 Unilateral primary osteoarthritis, left knee: Secondary | ICD-10-CM | POA: Diagnosis not present

## 2023-06-26 DIAGNOSIS — R262 Difficulty in walking, not elsewhere classified: Secondary | ICD-10-CM | POA: Diagnosis not present

## 2023-07-03 DIAGNOSIS — R262 Difficulty in walking, not elsewhere classified: Secondary | ICD-10-CM | POA: Diagnosis not present

## 2023-07-03 DIAGNOSIS — M1712 Unilateral primary osteoarthritis, left knee: Secondary | ICD-10-CM | POA: Diagnosis not present

## 2023-07-03 DIAGNOSIS — M25662 Stiffness of left knee, not elsewhere classified: Secondary | ICD-10-CM | POA: Diagnosis not present

## 2023-07-03 DIAGNOSIS — M25562 Pain in left knee: Secondary | ICD-10-CM | POA: Diagnosis not present

## 2023-07-08 DIAGNOSIS — H2513 Age-related nuclear cataract, bilateral: Secondary | ICD-10-CM | POA: Diagnosis not present

## 2023-07-10 DIAGNOSIS — M25562 Pain in left knee: Secondary | ICD-10-CM | POA: Diagnosis not present

## 2023-07-10 DIAGNOSIS — M25662 Stiffness of left knee, not elsewhere classified: Secondary | ICD-10-CM | POA: Diagnosis not present

## 2023-07-10 DIAGNOSIS — M1712 Unilateral primary osteoarthritis, left knee: Secondary | ICD-10-CM | POA: Diagnosis not present

## 2023-07-10 DIAGNOSIS — R262 Difficulty in walking, not elsewhere classified: Secondary | ICD-10-CM | POA: Diagnosis not present

## 2023-07-17 DIAGNOSIS — M25662 Stiffness of left knee, not elsewhere classified: Secondary | ICD-10-CM | POA: Diagnosis not present

## 2023-07-17 DIAGNOSIS — M25562 Pain in left knee: Secondary | ICD-10-CM | POA: Diagnosis not present

## 2023-07-17 DIAGNOSIS — R262 Difficulty in walking, not elsewhere classified: Secondary | ICD-10-CM | POA: Diagnosis not present

## 2023-07-17 DIAGNOSIS — M1712 Unilateral primary osteoarthritis, left knee: Secondary | ICD-10-CM | POA: Diagnosis not present

## 2023-09-23 DIAGNOSIS — Z8709 Personal history of other diseases of the respiratory system: Secondary | ICD-10-CM | POA: Diagnosis not present

## 2023-09-23 DIAGNOSIS — J209 Acute bronchitis, unspecified: Secondary | ICD-10-CM | POA: Diagnosis not present

## 2023-10-15 DIAGNOSIS — J45909 Unspecified asthma, uncomplicated: Secondary | ICD-10-CM | POA: Diagnosis not present

## 2023-10-15 DIAGNOSIS — E7800 Pure hypercholesterolemia, unspecified: Secondary | ICD-10-CM | POA: Diagnosis not present

## 2023-10-15 DIAGNOSIS — R059 Cough, unspecified: Secondary | ICD-10-CM | POA: Diagnosis not present

## 2023-10-15 DIAGNOSIS — Z0000 Encounter for general adult medical examination without abnormal findings: Secondary | ICD-10-CM | POA: Diagnosis not present

## 2023-10-15 DIAGNOSIS — I4891 Unspecified atrial fibrillation: Secondary | ICD-10-CM | POA: Diagnosis not present

## 2023-11-18 DIAGNOSIS — R490 Dysphonia: Secondary | ICD-10-CM | POA: Diagnosis not present

## 2024-01-10 ENCOUNTER — Encounter: Payer: Self-pay | Admitting: Gastroenterology

## 2024-03-19 ENCOUNTER — Encounter: Payer: Self-pay | Admitting: Gastroenterology

## 2024-03-19 ENCOUNTER — Ambulatory Visit: Payer: Medicare Other | Admitting: Gastroenterology

## 2024-03-19 VITALS — BP 130/80 | HR 80 | Ht 69.0 in | Wt 158.0 lb

## 2024-03-19 DIAGNOSIS — K5909 Other constipation: Secondary | ICD-10-CM | POA: Diagnosis not present

## 2024-03-19 DIAGNOSIS — K5900 Constipation, unspecified: Secondary | ICD-10-CM

## 2024-03-19 DIAGNOSIS — K649 Unspecified hemorrhoids: Secondary | ICD-10-CM

## 2024-03-19 DIAGNOSIS — Z860100 Personal history of colon polyps, unspecified: Secondary | ICD-10-CM

## 2024-03-19 DIAGNOSIS — Z1211 Encounter for screening for malignant neoplasm of colon: Secondary | ICD-10-CM

## 2024-03-19 NOTE — Progress Notes (Addendum)
 Discussed the use of AI scribe software for clinical note transcription with the patient, who gave verbal consent to proceed.  HPI : Dennis Shaffer is a 73 year old male with a history of bronchitis who presents with hemorrhoids and bowel irregularities  He has been experiencing bowel irregularities for the past five to seven months. He takes Miralax for three to four days, resulting in incomplete elimination characterized by 'little strips' rather than a normal bowel movement. After stopping Miralax, he has a normal bowel movement, but the pattern then repeats. He has not used the bathroom in three days. No blood in stool and no urinary symptoms.  He has a long-standing history of constipation, dating back to childhood, when he used Exlax. He thinks his last colonoscopy was in 2022, performed by Dr. Achilles Achilles, and he believes polyps were removed. He is due for another colonoscopy within five years. He experiences painful bowel movements, describing them as 'like passing a cow,' with hard stools but no blood. He currently takes Miralax as needed and has not tried fiber supplements like Metamucil or Benefiber. He occasionally takes tadalafil and uses Flonase for nasal allergies.  He reported experiencing a bulging sensation in his anus several months ago, which he described as feeling like a polyp, although he no longer feels it. He sometimes experiences pain during recent bowel movements, likely due to hard stools.     Past Medical History:  Diagnosis Date   Androgen deficiency    Bronchitis, mucopurulent recurrent (HCC)    Colon polyps    ED (erectile dysfunction)    Elevated LDL cholesterol level    IBS (irritable bowel syndrome)    OSA (obstructive sleep apnea)    Pneumonia    Rotator cuff tendonitis      Past Surgical History:  Procedure Laterality Date   colonscopy     Family History  Problem Relation Age of Onset   Gallstones Mother    Liver cancer Mother    Cancer  Maternal Grandfather        colon   Healthy Daughter    Healthy Daughter    Colon cancer Neg Hx    Esophageal cancer Neg Hx    Social History   Tobacco Use   Smoking status: Never   Smokeless tobacco: Never  Vaping Use   Vaping status: Never Used  Substance Use Topics   Alcohol use: Yes    Comment: rarely   Drug use: No   Current Outpatient Medications  Medication Sig Dispense Refill   albuterol (VENTOLIN HFA) 108 (90 Base) MCG/ACT inhaler Inhale 2 puffs into the lungs every 6 (six) hours as needed for wheezing or shortness of breath.     fluticasone (FLONASE) 50 MCG/ACT nasal spray Place 2 sprays into both nostrils daily.     No current facility-administered medications for this visit.   Allergies  Allergen Reactions   Naprosyn [Naproxen]    Voltaren [Diclofenac Sodium]      Review of Systems: All systems reviewed and negative except where noted in HPI.    No results found.  Physical Exam: BP 130/80   Pulse 80   Ht 5\' 9"  (1.753 m)   Wt 158 lb (71.7 kg)   BMI 23.33 kg/m  Constitutional: Pleasant,well-developed, African American male in no acute distress. HEENT: Normocephalic and atraumatic. Conjunctivae are normal. No scleral icterus. Neck supple.  Cardiovascular: Normal rate, regular rhythm.  Pulmonary/chest: Effort normal and breath sounds normal. No wheezing, rales or rhonchi.  Abdominal: Soft, nondistended, nontender. Bowel sounds active throughout. There are no masses palpable. No hepatomegaly. Extremities: no edema Neurological: Alert and oriented to person place and time. Skin: Skin is warm and dry. No rashes noted. Psychiatric: Normal mood and affect. Behavior is normal.  CBC    Component Value Date/Time   WBC 6.4 09/15/2020 1617   RBC 4.35 09/15/2020 1617   HGB 12.5 (L) 09/15/2020 1617   HCT 38.4 (L) 09/15/2020 1617   PLT 267.0 09/15/2020 1617   MCV 88.2 09/15/2020 1617   MCHC 32.6 09/15/2020 1617   RDW 13.3 09/15/2020 1617   LYMPHSABS 2.3  09/15/2020 1617   MONOABS 0.7 09/15/2020 1617   EOSABS 0.2 09/15/2020 1617   BASOSABS 0.1 09/15/2020 1617    CMP  No results found for: "NA", "K", "CL", "CO2", "GLUCOSE", "BUN", "CREATININE", "CALCIUM", "PROT", "ALBUMIN", "AST", "ALT", "ALKPHOS", "BILITOT", "GFRNONAA", "GFRAA"     Latest Ref Rng & Units 09/15/2020    4:17 PM  CBC EXTENDED  WBC 4.0 - 10.5 K/uL 6.4   RBC 4.22 - 5.81 Mil/uL 4.35   Hemoglobin 13.0 - 17.0 g/dL 13.0   HCT 86.5 - 78.4 % 38.4   Platelets 150.0 - 400.0 K/uL 267.0   NEUT# 1.4 - 7.7 K/uL 3.1   Lymph# 0.7 - 4.0 K/uL 2.3       ASSESSMENT AND PLAN:  73 year old male with chronic constipation and transient perianal bulge, which resolved spontaneously.  Constipation Chronic constipation with recent changes in bowel habits over the last 5-7 months. Experiences irregular bowel movements, often requiring polyethylene glycol (Miralax) to initiate bowel movements, which are described as incomplete and in strips. Previous use of senna (Exlax) in childhood indicates long-standing issues. Discussed benefits of fiber supplements like psyllium (Metamucil) to increase stool bulk and softness, potentially resolving the sensation of incomplete evacuation. - Recommend daily psyllium (Metamucil) to improve stool bulk and softness. - Advise continued use of polyethylene glycol (Miralax) as needed. - Suggest over-the-counter stimulant laxatives like senna (Senokot) if no bowel movement occurs for 2-3 days.  Hemorrhoids Reported feeling something unusual in the anus several months ago, but currently asymptomatic. - Recommend daily psyllium (Metamucil) to improve stool bulk and softness. - Advise continued use of polyethylene glycol (Miralax) as needed. - Suggest over-the-counter stimulant laxatives like senna (Senokot) if no bowel movement occurs for two days.  Colon cancer screening Colonoscopy in 2021 or 2022 reportedly revealed polyps.  Discussed that after age 72, colon  cancer screening becomes more selective unless high risk due to factors like a history of polyps or family history. If in good health with a reasonable life expectancy, another colonoscopy may be reasonable. - Obtain colonoscopy records from Dr. Achille Ache office to determine the exact date and findings of the last colonoscopy. - Schedule the next colonoscopy based on the findings and timing of the last procedure.  - If no colonoscopy in the past 3-4 years, may consider early colonoscopy to evaluate patient's symptoms.    Rosario Duey E. Cherryl Corona, MD [facility]    Alejandro Hurt, FNP   ADDENDUM: Colonoscopy report received from [facility]. Patient underwent colonoscopy in March 2023 by Dr. Lavaughn Portland. Colonoscopy was notable only for 2 small polyps, neither of which were precancerous.  Hemorrhoids were noted as well. Given normal colonoscopy at age 52, he would not need another colonoscopy for 7-10 years.  Based on his lack of risk factors, I would suggest against further colon cancer screening

## 2024-03-19 NOTE — Patient Instructions (Signed)
 Take metamucil daily.  Continue Miralax as needed or Sennakot two tablets at night when you do not have a bowel movement in 2-3 days.   _______________________________________________________  If your blood pressure at your visit was 140/90 or greater, please contact your primary care physician to follow up on this.  _______________________________________________________  If you are age 72 or older, your body mass index should be between 23-30. Your Body mass index is 23.33 kg/m. If this is out of the aforementioned range listed, please consider follow up with your Primary Care Provider.  If you are age 54 or younger, your body mass index should be between 19-25. Your Body mass index is 23.33 kg/m. If this is out of the aformentioned range listed, please consider follow up with your Primary Care Provider.   ________________________________________________________  The [facility] providers would like to encourage you to use [facility] to communicate with providers for non-urgent requests or questions.  Due to long hold times on the telephone, sending your provider a message by [facility] may be a faster and more efficient way to get a response.  Please allow 48 business hours for a response.  Please remember that this is for non-urgent requests.  _______________________________________________________ \  It was a pleasure to see you today!  Thank you for trusting me with your gastrointestinal care!    Scott E.Tomasa Rand, MD

## 2024-03-31 DIAGNOSIS — L219 Seborrheic dermatitis, unspecified: Secondary | ICD-10-CM | POA: Diagnosis not present

## 2024-05-04 ENCOUNTER — Telehealth: Payer: Self-pay | Admitting: Gastroenterology

## 2024-05-04 NOTE — Telephone Encounter (Signed)
 Good morning Dr. Cherryl Corona,   Per patient's last office notes, we have received the records from [facility] from patient's last colonoscopy. Records were scanned into Media for you to review.   Thank you.

## 2024-05-16 DIAGNOSIS — I4891 Unspecified atrial fibrillation: Secondary | ICD-10-CM | POA: Diagnosis not present

## 2024-05-16 DIAGNOSIS — E7800 Pure hypercholesterolemia, unspecified: Secondary | ICD-10-CM | POA: Diagnosis not present

## 2024-05-16 DIAGNOSIS — J45909 Unspecified asthma, uncomplicated: Secondary | ICD-10-CM | POA: Diagnosis not present

## 2024-07-14 DIAGNOSIS — H52223 Regular astigmatism, bilateral: Secondary | ICD-10-CM | POA: Diagnosis not present

## 2024-07-16 DIAGNOSIS — E7800 Pure hypercholesterolemia, unspecified: Secondary | ICD-10-CM | POA: Diagnosis not present

## 2024-07-16 DIAGNOSIS — I4891 Unspecified atrial fibrillation: Secondary | ICD-10-CM | POA: Diagnosis not present

## 2024-07-16 DIAGNOSIS — J45909 Unspecified asthma, uncomplicated: Secondary | ICD-10-CM | POA: Diagnosis not present

## 2024-08-16 DIAGNOSIS — E7800 Pure hypercholesterolemia, unspecified: Secondary | ICD-10-CM | POA: Diagnosis not present

## 2024-08-16 DIAGNOSIS — I4891 Unspecified atrial fibrillation: Secondary | ICD-10-CM | POA: Diagnosis not present

## 2024-08-16 DIAGNOSIS — J45909 Unspecified asthma, uncomplicated: Secondary | ICD-10-CM | POA: Diagnosis not present
